# Patient Record
Sex: Female | Born: 1955 | Race: Black or African American | Hispanic: No | Smoking: Former smoker
Health system: Southern US, Community
[De-identification: ages and names within clinical notes are randomized; demographics above are authoritative.]

## PROBLEM LIST (undated history)

## (undated) DIAGNOSIS — I10 Essential (primary) hypertension: Secondary | ICD-10-CM

## (undated) DIAGNOSIS — M549 Dorsalgia, unspecified: Secondary | ICD-10-CM

## (undated) DIAGNOSIS — M1990 Unspecified osteoarthritis, unspecified site: Secondary | ICD-10-CM

## (undated) HISTORY — PX: OTHER SURGICAL HISTORY: SHX169

---

## 1998-07-16 ENCOUNTER — Ambulatory Visit (HOSPITAL_BASED_OUTPATIENT_CLINIC_OR_DEPARTMENT_OTHER): Admission: RE | Admit: 1998-07-16 | Discharge: 1998-07-16 | Payer: Self-pay | Admitting: Orthopedic Surgery

## 2015-05-13 ENCOUNTER — Emergency Department (HOSPITAL_COMMUNITY)
Admission: EM | Admit: 2015-05-13 | Discharge: 2015-05-13 | Disposition: A | Discharge status: Home / Self Care | Payer: BC Managed Care – PPO | Attending: Emergency Medicine | Admitting: Emergency Medicine

## 2015-05-13 ENCOUNTER — Encounter (HOSPITAL_COMMUNITY): Payer: Self-pay | Admitting: Emergency Medicine

## 2015-05-13 DIAGNOSIS — G8929 Other chronic pain: Secondary | ICD-10-CM | POA: Diagnosis not present

## 2015-05-13 DIAGNOSIS — Z87891 Personal history of nicotine dependence: Secondary | ICD-10-CM | POA: Insufficient documentation

## 2015-05-13 DIAGNOSIS — M545 Low back pain: Secondary | ICD-10-CM | POA: Diagnosis not present

## 2015-05-13 DIAGNOSIS — M549 Dorsalgia, unspecified: Secondary | ICD-10-CM

## 2015-05-13 HISTORY — DX: Dorsalgia, unspecified: M54.9

## 2015-05-13 MED ORDER — KETOROLAC TROMETHAMINE 60 MG/2ML IM SOLN
60.0000 mg | Freq: Once | INTRAMUSCULAR | Status: AC
  Administered 2015-05-13: 60 mg via INTRAMUSCULAR
  Filled 2015-05-13: qty 2

## 2015-05-13 MED ORDER — DIAZEPAM 5 MG PO TABS
5.0000 mg | ORAL_TABLET | Freq: Once | ORAL | Status: AC
  Administered 2015-05-13: 5 mg via ORAL
  Filled 2015-05-13: qty 1

## 2015-05-13 MED ORDER — KETOROLAC TROMETHAMINE 10 MG PO TABS: 10.0000 mg | ORAL_TABLET | Freq: Four times a day (QID) | ORAL | Status: DC | PRN

## 2015-05-13 MED ORDER — DIAZEPAM 5 MG PO TABS: 5.0000 mg | ORAL_TABLET | Freq: Two times a day (BID) | ORAL | Status: AC

## 2015-05-13 NOTE — ED Provider Notes (Signed)
CSN: [id number]     Arrival date & time 05/13/15  0954 History  This chart was scribed for non-physician practitioner, Sharilyn Sites, PA-C, working with Gwyneth Sprout, MD by Charline Bills, ED Scribe. This patient was seen in room TR09C/TR09C and the patient's care was started at 11:33 AM.   Chief Complaint  Patient presents with  . Back Pain  . Leg Pain   The history is provided by the patient. No language interpreter was used.   HPI Comments: Bethany Frank is a 59 y.o. female, with a h/o chronic back pain, who presents to the Emergency Department complaining of constant lower back pain for the past few days. Pt describes pain as a constant burning sensation that radiates into her left leg and into her left foot. Pain is exacerbated with movement and lying on her left side. She denies injury or fall. She has tried Robaxin, Gabapentin and Mobic without significant relief. She reports that she has been seen by ortho in the past for similar pain. Pt has an upcoming appointment on 05/23/15 with ortho but states that she could not wait to be seen.  No numbness, paresthesias or weakness of extremities.  No loss of bowel or bladder control.  VSS.  Past Medical History  Diagnosis Date  . Back pain    History reviewed. No pertinent past surgical history. No family history on file. History  Substance Use Topics  . Smoking status: Former Games developer  . Smokeless tobacco: Not on file  . Alcohol Use: No   OB History    No data available     Review of Systems  Musculoskeletal: Positive for back pain.  All other systems reviewed and are negative.  Allergies  Review of patient's allergies indicates not on file.  Home Medications   Prior to Admission medications   Not on File   BP 132/82 mmHg  Pulse 88  Temp(Src) 97.9 F (36.6 C) (Oral)  Resp 16  SpO2 99% Physical Exam  Constitutional: She is oriented to person, place, and time. She appears well-developed and well-nourished.  HENT:  Head:  Normocephalic and atraumatic.  Mouth/Throat: Oropharynx is clear and moist.  Eyes: Conjunctivae and EOM are normal. Pupils are equal, round, and reactive to light.  Neck: Normal range of motion.  Cardiovascular: Normal rate, regular rhythm and normal heart sounds.   Pulmonary/Chest: Effort normal and breath sounds normal.  Abdominal: Soft. Bowel sounds are normal.  Musculoskeletal: Normal range of motion.  Tenderness of left lumbar paraspinal region without acute deformity, no midline tenderness, positive straight leg raise on left; normal strength and sensation of bilateral lower extremities, normal gait  Neurological: She is alert and oriented to person, place, and time.  Skin: Skin is warm and dry.  Psychiatric: She has a normal mood and affect.  Nursing note and vitals reviewed.  ED Course  Procedures (including critical care time) DIAGNOSTIC STUDIES: Oxygen Saturation is 99% on RA, normal by my interpretation.    COORDINATION OF CARE: 11:38 AM-Discussed treatment plan which includes Valium, Toradol injection and follow-up with specialist with pt at bedside and pt agreed to plan.   Labs Review Labs Reviewed - No data to display  Imaging Review No results found.   EKG Interpretation None      MDM   Final diagnoses:  Back pain, unspecified location   59 year old female with acute on chronic low back pain. She has been seen by multiple physicians for this without adequate control of her pain.  She  has upcoming appointment with orthopedics on 05/23/2015 but states she could not wait to be seen. Pain in her left lumbar paraspinal region, positive straight leg raise on left. No focal neurologic deficits to suggest cauda equina today. Do not feel emergent imaging indicated at this time. Patient was treated here with Toradol and Valium with some improvement of her pain, will discharge home with the same. She also requests referral to neurosurgery which was given.  Discussed plan  with patient, he/she acknowledged understanding and agreed with plan of care.  Return precautions given for new or worsening symptoms.  I personally performed the services described in this documentation, which was scribed in my presence. The recorded information has been reviewed and is accurate.  Garlon Hatchet, PA-C 05/13/15 1348  Gwyneth Sprout, MD 05/19/15 2151

## 2015-05-13 NOTE — ED Notes (Signed)
Declined W/C at D/C and was escorted to lobby by RN. 

## 2015-05-13 NOTE — ED Notes (Signed)
Pt c/o LBP radiating to left leg. States has seen "many doctors in the past...orthopedist, pain management".

## 2015-05-13 NOTE — Discharge Instructions (Signed)
Take the prescribed medication as directed. Follow-up with [place] neurosurgery-- call to make appt. Also recommend to keep your already pre-scheduled orthopedic appt. Return to the ED for new or worsening symptoms.

## 2015-05-26 ENCOUNTER — Other Ambulatory Visit: Payer: Self-pay | Admitting: Neurosurgery

## 2015-05-29 ENCOUNTER — Encounter (HOSPITAL_COMMUNITY)
Admission: RE | Admit: 2015-05-29 | Discharge: 2015-05-29 | Disposition: A | Discharge status: Home / Self Care | Payer: BC Managed Care – PPO | Source: Ambulatory Visit | Attending: Neurosurgery | Admitting: Neurosurgery

## 2015-05-29 ENCOUNTER — Encounter (HOSPITAL_COMMUNITY): Payer: Self-pay

## 2015-05-29 DIAGNOSIS — Z01812 Encounter for preprocedural laboratory examination: Secondary | ICD-10-CM | POA: Insufficient documentation

## 2015-05-29 DIAGNOSIS — Z0183 Encounter for blood typing: Secondary | ICD-10-CM | POA: Diagnosis not present

## 2015-05-29 DIAGNOSIS — M4310 Spondylolisthesis, site unspecified: Secondary | ICD-10-CM | POA: Diagnosis not present

## 2015-05-29 HISTORY — DX: Essential (primary) hypertension: I10

## 2015-05-29 HISTORY — DX: Unspecified osteoarthritis, unspecified site: M19.90

## 2015-05-29 LAB — CBC
HCT: 37.3 % (ref 36.0–46.0)
Hemoglobin: 12.5 g/dL (ref 12.0–15.0)
MCH: 27.8 pg (ref 26.0–34.0)
MCHC: 33.5 g/dL (ref 30.0–36.0)
MCV: 83.1 fL (ref 78.0–100.0)
PLATELETS: 373 10*3/uL (ref 150–400)
RBC: 4.49 MIL/uL (ref 3.87–5.11)
RDW: 13.8 % (ref 11.5–15.5)
WBC: 7.2 10*3/uL (ref 4.0–10.5)

## 2015-05-29 LAB — TYPE AND SCREEN
ABO/RH(D): A POS
ANTIBODY SCREEN: NEGATIVE

## 2015-05-29 LAB — BASIC METABOLIC PANEL
Anion gap: 12 (ref 5–15)
BUN: 10 mg/dL (ref 6–20)
CO2: 22 mmol/L (ref 22–32)
CREATININE: 0.54 mg/dL (ref 0.44–1.00)
Calcium: 9.4 mg/dL (ref 8.9–10.3)
Chloride: 106 mmol/L (ref 101–111)
Glucose, Bld: 121 mg/dL — ABNORMAL HIGH (ref 65–99)
Potassium: 3.5 mmol/L (ref 3.5–5.1)
SODIUM: 140 mmol/L (ref 135–145)

## 2015-05-29 LAB — SURGICAL PCR SCREEN
MRSA, PCR: NEGATIVE
STAPHYLOCOCCUS AUREUS: NEGATIVE

## 2015-05-29 LAB — ABO/RH: ABO/RH(D): A POS

## 2015-05-29 NOTE — Progress Notes (Signed)
This pt. Tested at an elevated level for obstructive sleep apnea during a pre-surgical visit using the STOP BANG TOOL. A score of 4 or greater is considered an elevated risk.

## 2015-05-29 NOTE — Pre-Procedure Instructions (Signed)
    Bethany Frank  05/29/2015      [id number] - [place], [place] - [address] [place] [place] [address] Phone: [phone] Fax: [phone]    Your procedure is scheduled on 06-03-2015  Tuesday   Report to [facility] Admitting at 10:30 A.M.   Call this number if you have problems the morning of surgery:  [phone]   Remember:  Do not eat food or drink liquids after midnight.   Take these medicines the morning of surgery with A SIP OF WATER amlodipine(Norvasc),diazepam)Valium),pain medication if needed,methocarbamol(Robaxin)    Do not wear jewelry, make-up or nail polish.  Do not wear lotions, powders, or perfumes.    Do not shave 48 hours prior to surgery.     Do not bring valuables to the hospital.  [facility] is not responsible for any belongings or valuables.  Contacts, dentures or bridgework may not be worn into surgery.  Leave your suitcase in the car.  After surgery it may be brought to your room.  For patients admitted to the hospital, discharge time will be determined by your treatment team.  Patients discharged the day of surgery will not be allowed to drive home.    Special instructions:  See attached sheet "Preparing for surgery" for instructions on CHG shower  Please read over the following fact sheets that you were given. Pain Booklet, Blood Transfusion Information and Surgical Site Infection Prevention

## 2015-06-02 MED ORDER — DEXTROSE 5 % IV SOLN
3.0000 g | INTRAVENOUS | Status: AC
  Administered 2015-06-03: 2 g via INTRAVENOUS
  Administered 2015-06-03: 3 g via INTRAVENOUS
  Filled 2015-06-02: qty 3000

## 2015-06-02 MED ORDER — DEXAMETHASONE SODIUM PHOSPHATE 10 MG/ML IJ SOLN
10.0000 mg | INTRAMUSCULAR | Status: AC
  Administered 2015-06-03: 10 mg via INTRAVENOUS

## 2015-06-03 ENCOUNTER — Inpatient Hospital Stay (HOSPITAL_COMMUNITY): Payer: BC Managed Care – PPO | Admitting: Anesthesiology

## 2015-06-03 ENCOUNTER — Inpatient Hospital Stay (HOSPITAL_COMMUNITY): Payer: BC Managed Care – PPO

## 2015-06-03 ENCOUNTER — Encounter (HOSPITAL_COMMUNITY)
Admission: RE | Disposition: A | Discharge status: Home / Self Care | Payer: Self-pay | Source: Ambulatory Visit | Attending: Neurosurgery

## 2015-06-03 ENCOUNTER — Encounter (HOSPITAL_COMMUNITY): Payer: Self-pay | Admitting: *Deleted

## 2015-06-03 ENCOUNTER — Inpatient Hospital Stay (HOSPITAL_COMMUNITY)
Admission: RE | Admit: 2015-06-03 | Discharge: 2015-06-06 | DRG: 460 | Disposition: A | Discharge status: Home / Self Care | Payer: BC Managed Care – PPO | Source: Ambulatory Visit | Attending: Neurosurgery | Admitting: Neurosurgery

## 2015-06-03 DIAGNOSIS — Z419 Encounter for procedure for purposes other than remedying health state, unspecified: Secondary | ICD-10-CM

## 2015-06-03 DIAGNOSIS — M545 Low back pain: Secondary | ICD-10-CM | POA: Diagnosis present

## 2015-06-03 DIAGNOSIS — M1990 Unspecified osteoarthritis, unspecified site: Secondary | ICD-10-CM | POA: Diagnosis present

## 2015-06-03 DIAGNOSIS — I10 Essential (primary) hypertension: Secondary | ICD-10-CM | POA: Diagnosis present

## 2015-06-03 DIAGNOSIS — M4317 Spondylolisthesis, lumbosacral region: Secondary | ICD-10-CM | POA: Diagnosis present

## 2015-06-03 DIAGNOSIS — M4316 Spondylolisthesis, lumbar region: Secondary | ICD-10-CM | POA: Diagnosis present

## 2015-06-03 DIAGNOSIS — Z87891 Personal history of nicotine dependence: Secondary | ICD-10-CM

## 2015-06-03 SURGERY — POSTERIOR LUMBAR FUSION 1 LEVEL
Anesthesia: General | Site: Back

## 2015-06-03 MED ORDER — OXYCODONE HCL 5 MG PO TABS
5.0000 mg | ORAL_TABLET | Freq: Once | ORAL | Status: AC | PRN
  Administered 2015-06-03: 5 mg via ORAL

## 2015-06-03 MED ORDER — ONDANSETRON HCL 4 MG/2ML IJ SOLN
INTRAMUSCULAR | Status: DC | PRN
  Administered 2015-06-03: 4 mg via INTRAVENOUS

## 2015-06-03 MED ORDER — LIDOCAINE HCL (CARDIAC) 20 MG/ML IV SOLN
INTRAVENOUS | Status: AC
  Filled 2015-06-03: qty 5

## 2015-06-03 MED ORDER — PHENYLEPHRINE HCL 10 MG/ML IJ SOLN
INTRAMUSCULAR | Status: DC | PRN
  Administered 2015-06-03: 80 ug via INTRAVENOUS

## 2015-06-03 MED ORDER — FENTANYL CITRATE (PF) 250 MCG/5ML IJ SOLN
INTRAMUSCULAR | Status: AC
  Filled 2015-06-03: qty 5

## 2015-06-03 MED ORDER — LACTATED RINGERS IV SOLN
INTRAVENOUS | Status: DC | PRN
  Administered 2015-06-03 (×2): via INTRAVENOUS

## 2015-06-03 MED ORDER — VANCOMYCIN HCL 1000 MG IV SOLR
INTRAVENOUS | Status: DC | PRN
  Administered 2015-06-03: 1000 mg via TOPICAL

## 2015-06-03 MED ORDER — BUPIVACAINE HCL (PF) 0.5 % IJ SOLN
INTRAMUSCULAR | Status: DC | PRN
  Administered 2015-06-03: 20 mL

## 2015-06-03 MED ORDER — GLYCOPYRROLATE 0.2 MG/ML IJ SOLN
INTRAMUSCULAR | Status: DC | PRN
  Administered 2015-06-03: 0.6 mg via INTRAVENOUS

## 2015-06-03 MED ORDER — ONDANSETRON HCL 4 MG/2ML IJ SOLN: 4.0000 mg | Freq: Once | INTRAMUSCULAR | Status: DC | PRN

## 2015-06-03 MED ORDER — AMLODIPINE BESYLATE 5 MG PO TABS
5.0000 mg | ORAL_TABLET | Freq: Every day | ORAL | Status: DC
  Administered 2015-06-04 – 2015-06-06 (×3): 5 mg via ORAL
  Filled 2015-06-03 (×3): qty 1

## 2015-06-03 MED ORDER — SODIUM CHLORIDE 0.9 % IR SOLN
Status: DC | PRN
  Administered 2015-06-03 (×2)

## 2015-06-03 MED ORDER — DEXAMETHASONE SODIUM PHOSPHATE 10 MG/ML IJ SOLN
INTRAMUSCULAR | Status: AC
  Filled 2015-06-03: qty 1

## 2015-06-03 MED ORDER — OXYCODONE HCL 5 MG/5ML PO SOLN: 5.0000 mg | Freq: Once | ORAL | Status: AC | PRN

## 2015-06-03 MED ORDER — ACETAMINOPHEN 325 MG PO TABS: 650.0000 mg | ORAL_TABLET | ORAL | Status: DC | PRN

## 2015-06-03 MED ORDER — KCL IN DEXTROSE-NACL 20-5-0.45 MEQ/L-%-% IV SOLN
80.0000 mL/h | INTRAVENOUS | Status: DC
Start: 2015-06-03 — End: 2015-06-06
  Administered 2015-06-03: 80 mL/h via INTRAVENOUS
  Filled 2015-06-03 (×8): qty 1000

## 2015-06-03 MED ORDER — PROPOFOL 10 MG/ML IV BOLUS
INTRAVENOUS | Status: DC | PRN
  Administered 2015-06-03: 150 mg via INTRAVENOUS

## 2015-06-03 MED ORDER — CEFAZOLIN SODIUM-DEXTROSE 2-3 GM-% IV SOLR
2.0000 g | Freq: Three times a day (TID) | INTRAVENOUS | Status: AC
  Administered 2015-06-04 (×2): 2 g via INTRAVENOUS
  Filled 2015-06-03 (×3): qty 50

## 2015-06-03 MED ORDER — SODIUM CHLORIDE 0.9 % IJ SOLN
3.0000 mL | Freq: Two times a day (BID) | INTRAMUSCULAR | Status: DC
Start: 2015-06-03 — End: 2015-06-06
  Administered 2015-06-03 – 2015-06-05 (×4): 3 mL via INTRAVENOUS

## 2015-06-03 MED ORDER — SODIUM CHLORIDE 0.9 % IJ SOLN: 3.0000 mL | INTRAMUSCULAR | Status: DC | PRN

## 2015-06-03 MED ORDER — METHOCARBAMOL 1000 MG/10ML IJ SOLN
500.0000 mg | Freq: Four times a day (QID) | INTRAMUSCULAR | Status: DC | PRN
  Filled 2015-06-03: qty 5

## 2015-06-03 MED ORDER — SODIUM CHLORIDE 0.9 % IV SOLN: 250.0000 mL | INTRAVENOUS | Status: DC

## 2015-06-03 MED ORDER — LIDOCAINE HCL (CARDIAC) 20 MG/ML IV SOLN
INTRAVENOUS | Status: DC | PRN
  Administered 2015-06-03: 100 mg via INTRAVENOUS

## 2015-06-03 MED ORDER — HYDROMORPHONE HCL 1 MG/ML IJ SOLN
INTRAMUSCULAR | Status: AC
  Filled 2015-06-03: qty 1

## 2015-06-03 MED ORDER — DOCUSATE SODIUM 100 MG PO CAPS
100.0000 mg | ORAL_CAPSULE | Freq: Two times a day (BID) | ORAL | Status: DC
  Administered 2015-06-03 – 2015-06-06 (×6): 100 mg via ORAL
  Filled 2015-06-03 (×6): qty 1

## 2015-06-03 MED ORDER — PROPOFOL 10 MG/ML IV BOLUS
INTRAVENOUS | Status: AC
  Filled 2015-06-03: qty 20

## 2015-06-03 MED ORDER — ROCURONIUM BROMIDE 50 MG/5ML IV SOLN
INTRAVENOUS | Status: AC
  Filled 2015-06-03: qty 1

## 2015-06-03 MED ORDER — OXYCODONE HCL 5 MG PO TABS
ORAL_TABLET | ORAL | Status: AC
  Filled 2015-06-03: qty 1

## 2015-06-03 MED ORDER — MIDAZOLAM HCL 2 MG/2ML IJ SOLN
INTRAMUSCULAR | Status: AC
  Filled 2015-06-03: qty 4

## 2015-06-03 MED ORDER — PHENOL 1.4 % MT LIQD: 1.0000 | OROMUCOSAL | Status: DC | PRN

## 2015-06-03 MED ORDER — ROCURONIUM BROMIDE 100 MG/10ML IV SOLN
INTRAVENOUS | Status: DC | PRN
  Administered 2015-06-03: 20 mg via INTRAVENOUS
  Administered 2015-06-03: 50 mg via INTRAVENOUS
  Administered 2015-06-03: 20 mg via INTRAVENOUS

## 2015-06-03 MED ORDER — OXYCODONE-ACETAMINOPHEN 5-325 MG PO TABS
1.0000 | ORAL_TABLET | ORAL | Status: DC | PRN
  Administered 2015-06-03 – 2015-06-06 (×10): 2 via ORAL
  Filled 2015-06-03 (×11): qty 2

## 2015-06-03 MED ORDER — HYDROMORPHONE HCL 1 MG/ML IJ SOLN
1.0000 mg | INTRAMUSCULAR | Status: DC | PRN
  Administered 2015-06-03 – 2015-06-04 (×3): 1 mg via INTRAMUSCULAR
  Administered 2015-06-04: 1.5 mg via INTRAMUSCULAR
  Administered 2015-06-04 – 2015-06-05 (×2): 1 mg via INTRAMUSCULAR
  Administered 2015-06-06: 1.5 mg via INTRAMUSCULAR
  Filled 2015-06-03 (×4): qty 1
  Filled 2015-06-03: qty 2
  Filled 2015-06-03 (×3): qty 1

## 2015-06-03 MED ORDER — CEFAZOLIN SODIUM-DEXTROSE 2-3 GM-% IV SOLR
INTRAVENOUS | Status: AC
  Filled 2015-06-03: qty 50

## 2015-06-03 MED ORDER — GLYCOPYRROLATE 0.2 MG/ML IJ SOLN
INTRAMUSCULAR | Status: AC
  Filled 2015-06-03: qty 3

## 2015-06-03 MED ORDER — THROMBIN 20000 UNITS EX SOLR
CUTANEOUS | Status: DC | PRN
  Administered 2015-06-03: 13:00:00 via TOPICAL

## 2015-06-03 MED ORDER — PHENYLEPHRINE HCL 10 MG/ML IJ SOLN
10.0000 mg | INTRAMUSCULAR | Status: DC | PRN
  Administered 2015-06-03: 20 ug/min via INTRAVENOUS

## 2015-06-03 MED ORDER — ZOLPIDEM TARTRATE 5 MG PO TABS: 5.0000 mg | ORAL_TABLET | Freq: Every evening | ORAL | Status: DC | PRN

## 2015-06-03 MED ORDER — 0.9 % SODIUM CHLORIDE (POUR BTL) OPTIME
TOPICAL | Status: DC | PRN
  Administered 2015-06-03: 1000 mL

## 2015-06-03 MED ORDER — PANTOPRAZOLE SODIUM 40 MG IV SOLR
40.0000 mg | Freq: Every day | INTRAVENOUS | Status: DC
  Administered 2015-06-03 – 2015-06-04 (×2): 40 mg via INTRAVENOUS
  Filled 2015-06-03 (×2): qty 40

## 2015-06-03 MED ORDER — HYDROMORPHONE HCL 1 MG/ML IJ SOLN
0.2500 mg | INTRAMUSCULAR | Status: DC | PRN
  Administered 2015-06-03 (×5): 0.5 mg via INTRAVENOUS

## 2015-06-03 MED ORDER — METHOCARBAMOL 500 MG PO TABS
500.0000 mg | ORAL_TABLET | Freq: Four times a day (QID) | ORAL | Status: DC | PRN
  Administered 2015-06-03 – 2015-06-06 (×6): 500 mg via ORAL
  Filled 2015-06-03 (×6): qty 1

## 2015-06-03 MED ORDER — METHOCARBAMOL 500 MG PO TABS
ORAL_TABLET | ORAL | Status: AC
  Filled 2015-06-03: qty 1

## 2015-06-03 MED ORDER — MIDAZOLAM HCL 5 MG/5ML IJ SOLN
INTRAMUSCULAR | Status: DC | PRN
  Administered 2015-06-03 (×2): 1 mg via INTRAVENOUS

## 2015-06-03 MED ORDER — ONDANSETRON HCL 4 MG/2ML IJ SOLN: 4.0000 mg | INTRAMUSCULAR | Status: DC | PRN

## 2015-06-03 MED ORDER — NEOSTIGMINE METHYLSULFATE 10 MG/10ML IV SOLN
INTRAVENOUS | Status: DC | PRN
  Administered 2015-06-03: 4 mg via INTRAVENOUS

## 2015-06-03 MED ORDER — MENTHOL 3 MG MT LOZG: 1.0000 | LOZENGE | OROMUCOSAL | Status: DC | PRN

## 2015-06-03 MED ORDER — ACETAMINOPHEN 650 MG RE SUPP: 650.0000 mg | RECTAL | Status: DC | PRN

## 2015-06-03 MED ORDER — ONDANSETRON HCL 4 MG/2ML IJ SOLN
INTRAMUSCULAR | Status: AC
  Filled 2015-06-03: qty 2

## 2015-06-03 MED ORDER — ALBUMIN HUMAN 5 % IV SOLN
INTRAVENOUS | Status: DC | PRN
  Administered 2015-06-03: 14:00:00 via INTRAVENOUS

## 2015-06-03 MED ORDER — FENTANYL CITRATE (PF) 100 MCG/2ML IJ SOLN
INTRAMUSCULAR | Status: DC | PRN
  Administered 2015-06-03 (×5): 50 ug via INTRAVENOUS

## 2015-06-03 MED ORDER — BISACODYL 5 MG PO TBEC
5.0000 mg | DELAYED_RELEASE_TABLET | Freq: Every day | ORAL | Status: DC | PRN
  Administered 2015-06-05: 5 mg via ORAL
  Filled 2015-06-03: qty 1

## 2015-06-03 MED ORDER — VANCOMYCIN HCL 1000 MG IV SOLR
INTRAVENOUS | Status: AC
  Filled 2015-06-03: qty 1000

## 2015-06-03 SURGICAL SUPPLY — 72 items
BAG DECANTER FOR FLEXI CONT (MISCELLANEOUS) ×9 IMPLANT
BENZOIN TINCTURE PRP APPL 2/3 (GAUZE/BANDAGES/DRESSINGS) ×3 IMPLANT
BLADE CLIPPER SURG (BLADE) ×3 IMPLANT
BONE EQUIVA 10CC (Bone Implant) ×3 IMPLANT
BRUSH SCRUB EZ PLAIN DRY (MISCELLANEOUS) ×3 IMPLANT
BUR CUTTER 7.0 ROUND (BURR) ×3 IMPLANT
BUR MATCHSTICK NEURO 3.0 LAGG (BURR) ×3 IMPLANT
CAGE PEEK OPTIMA ARDIS 11X9X26 (Cage) ×6 IMPLANT
CANISTER SUCT 3000ML PPV (MISCELLANEOUS) ×6 IMPLANT
CLOSURE WOUND 1/2 X4 (GAUZE/BANDAGES/DRESSINGS) ×1
CONT SPEC 4OZ CLIKSEAL STRL BL (MISCELLANEOUS) ×3 IMPLANT
COVER BACK TABLE 60X90IN (DRAPES) ×3 IMPLANT
DRAPE C-ARM 42X72 X-RAY (DRAPES) ×6 IMPLANT
DRAPE C-ARMOR (DRAPES) ×3 IMPLANT
DRAPE LAPAROTOMY 100X72X124 (DRAPES) ×3 IMPLANT
DRAPE SURG 17X23 STRL (DRAPES) ×6 IMPLANT
DRSG OPSITE POSTOP 4X6 (GAUZE/BANDAGES/DRESSINGS) ×3 IMPLANT
DURAPREP 26ML APPLICATOR (WOUND CARE) ×3 IMPLANT
ELECT REM PT RETURN 9FT ADLT (ELECTROSURGICAL) ×3
ELECTRODE REM PT RTRN 9FT ADLT (ELECTROSURGICAL) ×1 IMPLANT
EVACUATOR 1/8 PVC DRAIN (DRAIN) IMPLANT
EVACUATOR 3/16  PVC DRAIN (DRAIN) ×2
EVACUATOR 3/16 PVC DRAIN (DRAIN) ×1 IMPLANT
GAUZE SPONGE 4X4 12PLY STRL (GAUZE/BANDAGES/DRESSINGS) ×3 IMPLANT
GAUZE SPONGE 4X4 16PLY XRAY LF (GAUZE/BANDAGES/DRESSINGS) IMPLANT
GLOVE BIOGEL PI IND STRL 7.0 (GLOVE) ×2 IMPLANT
GLOVE BIOGEL PI IND STRL 7.5 (GLOVE) ×2 IMPLANT
GLOVE BIOGEL PI INDICATOR 7.0 (GLOVE) ×4
GLOVE BIOGEL PI INDICATOR 7.5 (GLOVE) ×4
GLOVE ECLIPSE 7.0 STRL STRAW (GLOVE) ×3 IMPLANT
GLOVE ECLIPSE 8.0 STRL XLNG CF (GLOVE) ×6 IMPLANT
GLOVE EXAM NITRILE LRG STRL (GLOVE) IMPLANT
GLOVE EXAM NITRILE MD LF STRL (GLOVE) IMPLANT
GLOVE EXAM NITRILE XS STR PU (GLOVE) IMPLANT
GOWN STRL REUS W/ TWL LRG LVL3 (GOWN DISPOSABLE) IMPLANT
GOWN STRL REUS W/ TWL XL LVL3 (GOWN DISPOSABLE) ×2 IMPLANT
GOWN STRL REUS W/TWL 2XL LVL3 (GOWN DISPOSABLE) IMPLANT
GOWN STRL REUS W/TWL LRG LVL3 (GOWN DISPOSABLE)
GOWN STRL REUS W/TWL XL LVL3 (GOWN DISPOSABLE) ×4
HANDLE PEDIGUARD CANNULATED (INSTRUMENTS) ×3 IMPLANT
K-WIRE NITHNOL TROCAR TIP (WIRE) ×12 IMPLANT
KIT BASIN OR (CUSTOM PROCEDURE TRAY) ×3 IMPLANT
KIT ROOM TURNOVER OR (KITS) ×3 IMPLANT
LIQUID BAND (GAUZE/BANDAGES/DRESSINGS) IMPLANT
MILL MEDIUM DISP (BLADE) ×3 IMPLANT
NEEDLE 1 PEDIGUARD CANNULATED (NEEDLE) ×6 IMPLANT
NEEDLE HYPO 22GX1.5 SAFETY (NEEDLE) ×3 IMPLANT
NEEDLE TARGETING (NEEDLE) ×6 IMPLANT
NS IRRIG 1000ML POUR BTL (IV SOLUTION) ×3 IMPLANT
PACK LAMINECTOMY NEURO (CUSTOM PROCEDURE TRAY) ×3 IMPLANT
PAD ARMBOARD 7.5X6 YLW CONV (MISCELLANEOUS) ×12 IMPLANT
PATTIES SURGICAL .75X.75 (GAUZE/BANDAGES/DRESSINGS) IMPLANT
ROD PATHFINDER 40MM (Rod) ×6 IMPLANT
SCREW MIN INVASIVE 6.5X35 (Screw) ×6 IMPLANT
SCREW POLYAXIA MIS 6.5X40MM (Screw) ×3 IMPLANT
SHEATH PAT (SHEATH) ×3 IMPLANT
SPONGE LAP 4X18 X RAY DECT (DISPOSABLE) IMPLANT
SPONGE SURGIFOAM ABS GEL 100 (HEMOSTASIS) ×3 IMPLANT
STRIP CLOSURE SKIN 1/2X4 (GAUZE/BANDAGES/DRESSINGS) ×2 IMPLANT
SUT PROLENE 0 CT 1 30 (SUTURE) ×3 IMPLANT
SUT VIC AB 0 CT1 18XCR BRD8 (SUTURE) ×2 IMPLANT
SUT VIC AB 0 CT1 8-18 (SUTURE) ×4
SUT VIC AB 2-0 OS6 18 (SUTURE) ×12 IMPLANT
SUT VIC AB 3-0 CP2 18 (SUTURE) ×6 IMPLANT
SYR 20ML ECCENTRIC (SYRINGE) ×3 IMPLANT
TOP CLSR SEQUOIA (Orthopedic Implant) ×12 IMPLANT
TOWEL OR 17X24 6PK STRL BLUE (TOWEL DISPOSABLE) ×3 IMPLANT
TOWEL OR 17X26 10 PK STRL BLUE (TOWEL DISPOSABLE) ×3 IMPLANT
TRAP SPECIMEN MUCOUS 40CC (MISCELLANEOUS) ×3 IMPLANT
TRAY FOLEY CATH 16FRSI W/METER (SET/KITS/TRAYS/PACK) ×3 IMPLANT
TRAY FOLEY W/METER SILVER 14FR (SET/KITS/TRAYS/PACK) IMPLANT
WATER STERILE IRR 1000ML POUR (IV SOLUTION) ×3 IMPLANT

## 2015-06-03 NOTE — Op Note (Signed)
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 Localizing it was taken prior to incision to identify the appropriate level. Midline incision was made above the spinous processes of L5 and S1 and carried down to the dorsal lumbar fascia. The plane between the subcutaneous fat and the fascia was dissected free. Subperiosteal dissection was then carried out on the spinous processes lamina facet joint of L5 and S1. Self-retaining tract was placed for exposure and x-ray showed approach the appropriate level. In a piecemeal fashion the free-floating lamina and spinous process and inferior facet of L5 were removed. The residual superior facet above the pars defect was identified and removed to decompress the L5 nerve roots bilaterally as it came around the pedicle of L5. All residual compressive material was removed. The disc space was then identified and incised bilaterally and thoroughly cleaned out. There is actually a herniated disc on the patient's left side causing marked L5 nerve root compression this was removed and give excellent decompression of the L5 and S1 nerve roots bilaterally. The disc space was then prepared for interbody fusion. It was distracted up to an 11 mm size and that was felt to be a good choice. Aggressive cleanout of the disc was carried out and then we placed our first peek spacer which was 11 x 9 x 26 mm  filled with a mixture of autologous bone and morselized allograft. It was followed in excellent position. Some replace was then carried on the opposite side at the same mixture was placed deep within the interspace to help with the interbody fusion. At this time irrigation was carried out and any bleeding control proper coagulation Gelfoam. We then closed the dorsal lumbar fashion the midline placed percutaneous pedicle screws at L5 and S1 bilaterally. We passed the Jamshidi needle without difficulty and placed guidewires. We tapped with a 6 mm tap and then placed 6.5 x 40 Miller screws at L5 and 6.5 x 35 mm screws at S1. We then passed appropriate length rods down to the towers and secured them to the top of the screws with top loading nuts. Tightening and final tightening with torque and counter torque were done and final fluoroscopy in AP lateral direction looked excellent. The was then irrigated copiously and closed with inverted Vicryls on the fascia followed by 2 (30 Vicryls on the subcutaneous and subcuticular tissues. A drain was left in the suprafascial space. A running locking point was placed on the skin. A sterile dressing was then applied and the patient was extubated and taken to recovery in stable condition.

## 2015-06-03 NOTE — Anesthesia Preprocedure Evaluation (Addendum)
Anesthesia Evaluation  Patient identified by MRN, date of birth, ID band Patient awake    Reviewed: Allergy & Precautions, NPO status , Patient's Chart, lab work & pertinent test results  History of Anesthesia Complications Negative for: history of anesthetic complications  Airway Mallampati: III  TM Distance: >3 FB Neck ROM: Full    Dental  (+) Dental Advisory Given, Upper Dentures   Pulmonary former smoker,  breath sounds clear to auscultation  Pulmonary exam normal       Cardiovascular Exercise Tolerance: Good hypertension, Pt. on medications - angina- Past MI Normal cardiovascular examRhythm:Regular Rate:Normal     Neuro/Psych negative neurological ROS     GI/Hepatic negative GI ROS, Neg liver ROS,   Endo/Other  Morbid obesity  Renal/GU negative Renal ROS     Musculoskeletal  (+) Arthritis -, Osteoarthritis,    Abdominal   Peds  Hematology negative hematology ROS (+)   Anesthesia Other Findings Drank bottle of water at 1000  Reproductive/Obstetrics                            Anesthesia Physical Anesthesia Plan  ASA: III  Anesthesia Plan: General   Post-op Pain Management:    Induction: Intravenous  Airway Management Planned: Oral ETT  Additional Equipment:   Intra-op Plan:   Post-operative Plan: Extubation in OR  Informed Consent: I have reviewed the patients History and Physical, chart, labs and discussed the procedure including the risks, benefits and alternatives for the proposed anesthesia with the patient or authorized representative who has indicated his/her understanding and acceptance.   Dental advisory given  Plan Discussed with: Anesthesiologist and Surgeon  Anesthesia Plan Comments: (Risks/benefits of general anesthesia discussed with patient including risk of damage to teeth, lips, gum, and tongue, nausea/vomiting, allergic reactions to medications, and the  possibility of heart attack, stroke and death.  All patient questions answered.  Patient wishes to proceed.)       Anesthesia Quick Evaluation

## 2015-06-03 NOTE — Progress Notes (Addendum)
Dr.Turk notified of pt drinking a whole bottle of water at 1000 to take her meds.OK to send on to the holding.

## 2015-06-03 NOTE — H&P (Signed)
  Bethany Frank is an 59 y.o. female.   Chief Complaint: Back pain into the left leg HPI: The patient is a 59 year old female who is evaluated in the office for back pain with radiation the left leg of years duration. It started when she was picking up a trash bag. She saw her medical doctor and pain management specialist and has tried medications including Neurontin anti-inflammatory medication. She's had epidural shots without relief along with physical therapy which gave her no help. An MRI scan was in earlier this year and she was evaluated for neurosurgical opinion. When seen in the RIGHT leg was asymptomatic. She says that walking dramatically increases her pain. In the office for MRI scan was reviewed which showed pars defects at L5 with grade 1 spondylolisthesis L5-S1 and severe foraminal encroachment with L5 nerve root compression. At this time the options were discussed.. The patient requested surgery due to the lack of improvement with conservative therapy and now comes for an L5-S1 decompression with pedicle screw fixation. I've had a long discussion with her regarding the risks and benefits of surgical intervention. The risks discussed include but are not limited to bleeding infection weakness some as paralysis spinal fluid leak trouble with instrumentation nonunion coma and death. We have discussed alternative methods of therapy along with the risks and benefits of nonintervention. She's had the opportunity to ask numerous questions and appears to understand. With this information in hand she has requested that we proceed with surgery.  Past Medical History  Diagnosis Date  . Back pain   . Hypertension   . Arthritis     Past Surgical History  Procedure Laterality Date  . Carpal tunnell Right     History reviewed. No pertinent family history. Social History:  reports that she quit smoking about 7 years ago. Her smoking use included Cigarettes. She has a 3.75 pack-year smoking history.  She does not have any smokeless tobacco history on file. She reports that she does not drink alcohol or use illicit drugs.  Allergies: No Known Allergies  Medications Prior to Admission  Medication Sig Dispense Refill  . amLODipine (NORVASC) 5 MG tablet Take 5 mg by mouth daily.    . diazepam (VALIUM) 5 MG tablet Take 1 tablet (5 mg total) by mouth 2 (two) times daily. 10 tablet 0  . HYDROcodone-acetaminophen (NORCO/VICODIN) 5-325 MG per tablet Take 1 tablet by mouth every 6 (six) hours as needed for moderate pain.   0  . methocarbamol (ROBAXIN) 500 MG tablet Take 500-1,000 mg by mouth 4 (four) times daily as needed for muscle spasms.     . rosuvastatin (CRESTOR) 10 MG tablet Take 10 mg by mouth daily.    . Vitamin D, Ergocalciferol, (DRISDOL) 50000 UNITS CAPS capsule Take 50,000 Units by mouth every 30 (thirty) days.      No results found for this or any previous visit (from the past 48 hour(s)). No results found.  Unremarkable  Blood pressure 158/92, pulse 96, temperature 98.8 F (37.1 C), temperature source Oral, resp. rate 18, height  (1.549 m), weight 119.75 kg (264 lb), SpO2 97 %.  The patient is awake alert and oriented. She is no facial asymmetry. Her ankle jerk reflexes bilaterally decreased. She has decreased extensor pollicis longus strength on the left. Assessment/Plan Impression is that of spondylolysis with spondylolisthesis L5-S1. The plan is for a decompression with interbody fusion and pedicle screw fixation.  Reinaldo Meeker, MD 06/03/2015, 11:26 AM

## 2015-06-03 NOTE — Transfer of Care (Signed)
Immediate Anesthesia Transfer of Care Note  Patient: Bethany Frank  Procedure(s) Performed: Procedure(s): posterior lumbar interbody fusion lumbar vertebrae five - sacral one (N/A)  Patient Location: PACU  Anesthesia Type:General  Level of Consciousness: awake, alert  and oriented  Airway & Oxygen Therapy: Patient Spontanous Breathing and Patient connected to nasal cannula oxygen  Post-op Assessment: Report given to RN, Post -op Vital signs reviewed and stable and Patient moving all extremities X 4  Post vital signs: Reviewed and stable  Last Vitals:  Filed Vitals:   06/03/15 1702  BP: 143/69  Pulse:   Temp: 36.2 C  Resp: 18    Complications: No apparent anesthesia complications

## 2015-06-03 NOTE — Anesthesia Postprocedure Evaluation (Signed)
  Anesthesia Post-op Note  Patient: Bethany Frank  Procedure(s) Performed: Procedure(s): posterior lumbar interbody fusion lumbar vertebrae five - sacral one (N/A)  Patient Location: PACU  Anesthesia Type:General  Level of Consciousness: awake, alert  and oriented  Airway and Oxygen Therapy: Patient Spontanous Breathing  Post-op Pain: mild  Post-op Assessment: Post-op Vital signs reviewed, Patient's Cardiovascular Status Stable, Respiratory Function Stable, Patent Airway and Pain level controlled LLE Motor Response: Purposeful movement LLE Sensation: Full sensation RLE Motor Response: Purposeful movement RLE Sensation: Full sensation      Post-op Vital Signs: stable  Last Vitals:  Filed Vitals:   06/03/15 1815  BP: 128/96  Pulse:   Temp:   Resp:     Complications: No apparent anesthesia complications

## 2015-06-03 NOTE — Anesthesia Procedure Notes (Signed)
Procedure Name: Intubation Date/Time: 06/03/2015 12:09 PM Performed by: Leonel Ramsay Pre-anesthesia Checklist: Patient identified, Timeout performed, Emergency Drugs available, Suction available and Patient being monitored Patient Re-evaluated:Patient Re-evaluated prior to inductionOxygen Delivery Method: Circle system utilized Preoxygenation: Pre-oxygenation with 100% oxygen Intubation Type: IV induction Ventilation: Mask ventilation without difficulty Laryngoscope Size: Mac and 3 Grade View: Grade I Tube type: Oral Tube size: 7.0 mm Number of attempts: 1 Airway Equipment and Method: Stylet Placement Confirmation: ETT inserted through vocal cords under direct vision,  positive ETCO2 and breath sounds checked- equal and bilateral Secured at: 22 cm Tube secured with: Tape Dental Injury: Teeth and Oropharynx as per pre-operative assessment

## 2015-06-04 NOTE — Progress Notes (Signed)
Received call back from MD's office, order to d/c hemo vac received

## 2015-06-04 NOTE — Progress Notes (Signed)
Patient pulled hemo vac on accident. MD's office called to notify.

## 2015-06-04 NOTE — Progress Notes (Signed)
Patient ID: Bethany Frank, female   DOB: 01-Feb-1956, 59 y.o.   MRN: [id number] Afeb, vss No new neuro issues Wound clean and dry; Drain working well Will increase activity, and hope for d/c tomorrow.

## 2015-06-05 MED ORDER — PANTOPRAZOLE SODIUM 40 MG PO TBEC
40.0000 mg | DELAYED_RELEASE_TABLET | Freq: Every day | ORAL | Status: DC
  Administered 2015-06-05: 40 mg via ORAL
  Filled 2015-06-05: qty 1

## 2015-06-05 NOTE — Care Management Note (Signed)
Case Management Note  Patient Details  Name: Bethany Frank MRN: [id number] Date of Birth: 1956/01/10  Subjective/Objective:                    Action/Plan: Patient was admitted for a PLIF. Lives at home with children.  Will follow for discharge needs pending physician orders.  Expected Discharge Date:   (pending)               Expected Discharge Plan:  Home/Self Care  In-House Referral:     Discharge planning Services     Post Acute Care Choice:    Choice offered to:     DME Arranged:    DME Agency:     HH Arranged:    HH Agency:     Status of Service:  In process, will continue to follow  Medicare Important Message Given:    Date Medicare IM Given:    Medicare IM give by:    Date Additional Medicare IM Given:    Additional Medicare Important Message give by:     If discussed at Long Length of Stay Meetings, dates discussed:    Additional Comments:  Anda Kraft, RN 06/05/2015, 10:15 AM

## 2015-06-05 NOTE — Progress Notes (Signed)
Patient ID: Bethany Frank, female   DOB: Jun 03, 1956, 59 y.o.   MRN: [id number] Afeb, vss No new neuro issues Somewhat slow to increase activity, and she feels she would benefit from 1 more day. Will plan to d/c tomorrow.

## 2015-06-06 MED ORDER — OXYCODONE-ACETAMINOPHEN 5-325 MG PO TABS: 1.0000 | ORAL_TABLET | ORAL | Status: AC | PRN

## 2015-06-06 NOTE — Discharge Summary (Signed)
  Physician Discharge Summary  Patient ID: Bethany Frank MRN: [id number] DOB/AGE: 57-Dec-1957 57 y.o.  Admit date: 06/03/2015 Discharge date: 06/06/2015  Admission Diagnoses:  Discharge Diagnoses:  Active Problems:   Spondylolisthesis of lumbar region   Discharged Condition: good  Hospital Course: Surgery 3 days ago for lumbar fusion. Did well, though slow to increase activity. Slowly improved and increased ambulation. Wound healing well. Home pod 3, specific instructions given.  Consults: None  Significant Diagnostic Studies: none  Treatments: surgery: L5 S1 plif with pedicle screws  Discharge Exam: Blood pressure 111/60, pulse 103, temperature 98.8 F (37.1 C), temperature source Oral, resp. rate 20, height  (1.549 m), weight 119.75 kg (264 lb), SpO2 90 %. Incision/Wound:clean and dry; no new neuro issues  Disposition: 01-Home or Self Care     Medication List    ASK your doctor about these medications        amLODipine 5 MG tablet  Commonly known as:  NORVASC  Take 5 mg by mouth daily.     diazepam 5 MG tablet  Commonly known as:  VALIUM  Take 1 tablet (5 mg total) by mouth 2 (two) times daily.     HYDROcodone-acetaminophen 5-325 MG per tablet  Commonly known as:  NORCO/VICODIN  Take 1 tablet by mouth every 6 (six) hours as needed for moderate pain.     methocarbamol 500 MG tablet  Commonly known as:  ROBAXIN  Take 500-1,000 mg by mouth 4 (four) times daily as needed for muscle spasms.     rosuvastatin 10 MG tablet  Commonly known as:  CRESTOR  Take 10 mg by mouth daily.     Vitamin D (Ergocalciferol) 50000 UNITS Caps capsule  Commonly known as:  DRISDOL  Take 50,000 Units by mouth every 30 (thirty) days.         At home rest most of the time. Get up 9 or 10 times each day and take a 15 or 20 minute walk. No riding in the car and to your first postoperative appointment. If you have neck surgery you may shower from the chest down starting on the  third postoperative day. If you had back surgery he may start showering on the third postoperative day with saran wrap wrapped around your incisional area 3 times. After the shower remove the saran wrap. Take pain medicine as needed and other medications as instructed. Call my office for an appointment.  SignedReinaldo Meeker, MD 06/06/2015, 11:07 AM

## 2015-09-18 IMAGING — RF DG LUMBAR SPINE 2-3V
1 series · 2 of 2 positions shown · non-contrast
Comparison: Lumbar MRI 01/28/2015.

CLINICAL DATA: L5-S1 PLIF.

EXAM:
LUMBAR SPINE - 2-3 VIEW; DG C-ARM 61-120 MIN

[Series 1: run · 2 of 2 slices shown]
[im 1/2]
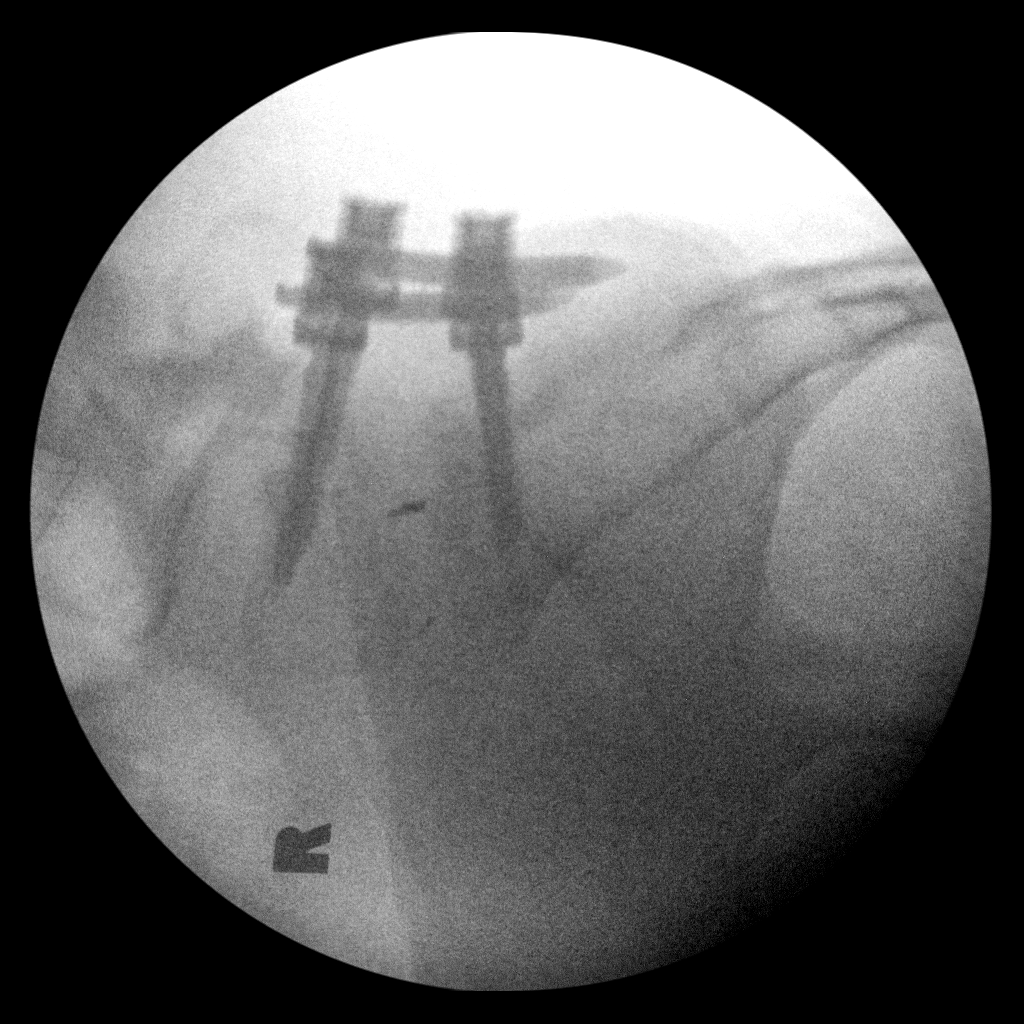
[im 2/2]
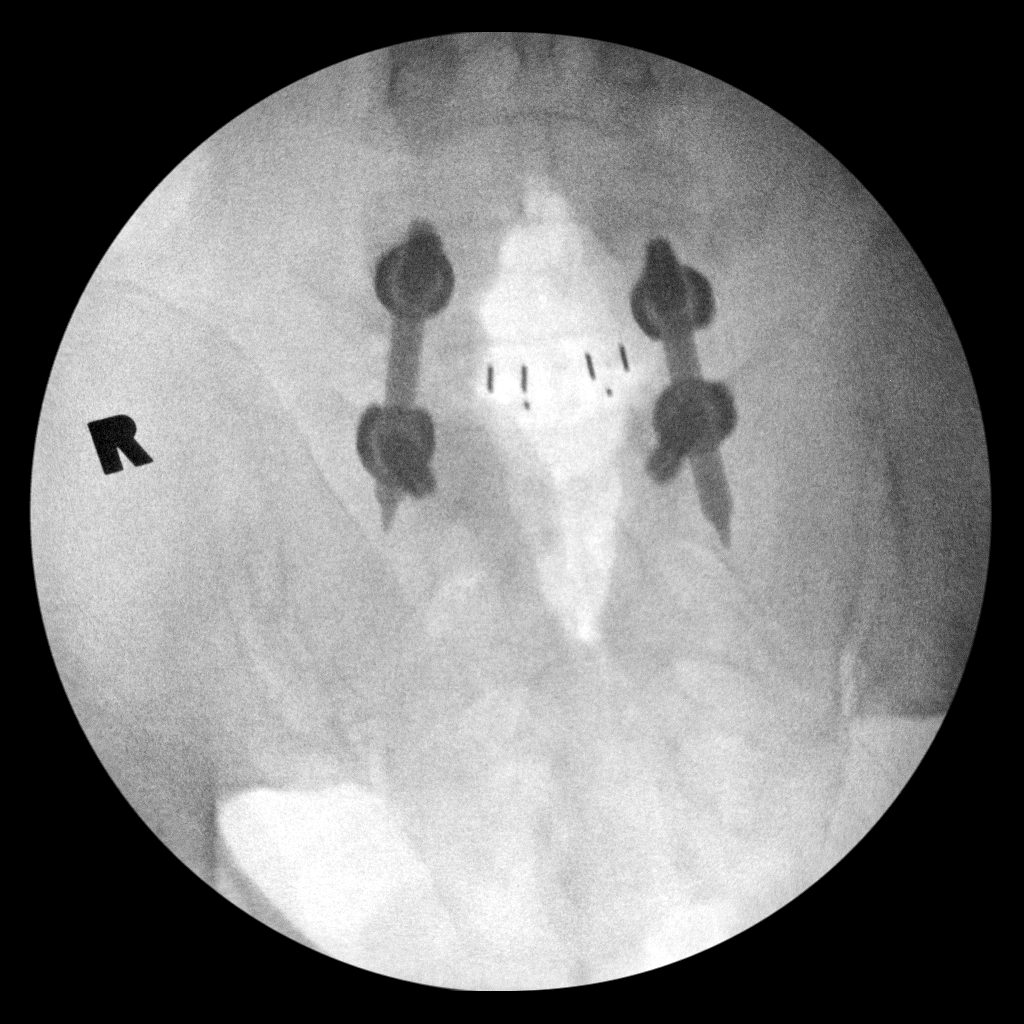

[2 of 2 positions shown; findings below may reference images not displayed]

FLUOROSCOPY TIME:]:
FLUOROSCOPY TIME:]
C-arm fluoroscopic images were obtained intraoperatively and
submitted for post operative interpretation. Please see the
performing provider's procedural report for the fluoroscopy time
utilized.
FINDINGS: Spot PA and lateral fluoroscopic images of the lower lumbar spine
demonstrate interval L5-S1 PLIF. The pedicle screws, interconnecting
rods and interbody spacer appear well positioned. Soft tissue defect
is present at the laminectomy. The anterolisthesis at L5-S1 appears
slightly improved.
IMPRESSION: Intraoperative views during L5-S1 PLIF.
